# Patient Record
Sex: Male | Born: 1995 | Race: Black or African American | Hispanic: No | Marital: Married | Smoking: Never smoker
Health system: Southern US, Community
[De-identification: ages and names within clinical notes are randomized; demographics above are authoritative.]

---

## 2015-05-14 ENCOUNTER — Emergency Department (HOSPITAL_BASED_OUTPATIENT_CLINIC_OR_DEPARTMENT_OTHER): Payer: BLUE CROSS/BLUE SHIELD

## 2015-05-14 ENCOUNTER — Encounter (HOSPITAL_BASED_OUTPATIENT_CLINIC_OR_DEPARTMENT_OTHER): Payer: Self-pay | Admitting: *Deleted

## 2015-05-14 ENCOUNTER — Emergency Department (HOSPITAL_BASED_OUTPATIENT_CLINIC_OR_DEPARTMENT_OTHER)
Admission: EM | Admit: 2015-05-14 | Discharge: 2015-05-14 | Disposition: A | Discharge status: Home / Self Care | Payer: BLUE CROSS/BLUE SHIELD | Attending: Emergency Medicine | Admitting: Emergency Medicine

## 2015-05-14 DIAGNOSIS — Y929 Unspecified place or not applicable: Secondary | ICD-10-CM | POA: Diagnosis not present

## 2015-05-14 DIAGNOSIS — Y999 Unspecified external cause status: Secondary | ICD-10-CM | POA: Diagnosis not present

## 2015-05-14 DIAGNOSIS — S93401A Sprain of unspecified ligament of right ankle, initial encounter: Secondary | ICD-10-CM | POA: Insufficient documentation

## 2015-05-14 DIAGNOSIS — Y9367 Activity, basketball: Secondary | ICD-10-CM | POA: Insufficient documentation

## 2015-05-14 DIAGNOSIS — S99911A Unspecified injury of right ankle, initial encounter: Secondary | ICD-10-CM | POA: Diagnosis present

## 2015-05-14 DIAGNOSIS — X500XXA Overexertion from strenuous movement or load, initial encounter: Secondary | ICD-10-CM | POA: Diagnosis not present

## 2015-05-14 NOTE — ED Notes (Signed)
PA at bedside.

## 2015-05-14 NOTE — Discharge Instructions (Signed)
Rest, Ice intermittently (in the first 24-48 hours), Gentle compression with an Ace wrap, and elevate (Limb above the level of the heart) °  °Take up to 800mg of ibuprofen (that is usually 4 over the counter pills)  3 times a day for 5 days. Take with food. ° °Do not hesitate to return to the emergency room for any new, worsening or concerning symptoms. ° °Please obtain primary care using resource guide below. Let them know that you were seen in the emergency room and that they will need to obtain records for further outpatient management. ° ° ° °Ankle Sprain °An ankle sprain is an injury to the strong, fibrous tissues (ligaments) that hold the bones of your ankle joint together.  °CAUSES °An ankle sprain is usually caused by a fall or by twisting your ankle. Ankle sprains most commonly occur when you step on the outer edge of your foot, and your ankle turns inward. People who participate in sports are more prone to these types of injuries.  °SYMPTOMS  °· Pain in your ankle. The pain may be present at rest or only when you are trying to stand or walk. °· Swelling. °· Bruising. Bruising may develop immediately or within 1 to 2 days after your injury. °· Difficulty standing or walking, particularly when turning corners or changing directions. °DIAGNOSIS  °Your caregiver will ask you details about your injury and perform a physical exam of your ankle to determine if you have an ankle sprain. During the physical exam, your caregiver will press on and apply pressure to specific areas of your foot and ankle. Your caregiver will try to move your ankle in certain ways. An X-ray exam may be done to be sure a bone was not broken or a ligament did not separate from one of the bones in your ankle (avulsion fracture).  °TREATMENT  °Certain types of braces can help stabilize your ankle. Your caregiver can make a recommendation for this. Your caregiver may recommend the use of medicine for pain. If your sprain is severe, your  caregiver may refer you to a surgeon who helps to restore function to parts of your skeletal system (orthopedist) or a physical therapist. °HOME CARE INSTRUCTIONS  °· Apply ice to your injury for 1-2 days or as directed by your caregiver. Applying ice helps to reduce inflammation and pain. °¨ Put ice in a plastic bag. °¨ Place a towel between your skin and the bag. °¨ Leave the ice on for 15-20 minutes at a time, every 2 hours while you are awake. °· Only take over-the-counter or prescription medicines for pain, discomfort, or fever as directed by your caregiver. °· Elevate your injured ankle above the level of your heart as much as possible for 2-3 days. °· If your caregiver recommends crutches, use them as instructed. Gradually put weight on the affected ankle. Continue to use crutches or a cane until you can walk without feeling pain in your ankle. °· If you have a plaster splint, wear the splint as directed by your caregiver. Do not rest it on anything harder than a pillow for the first 24 hours. Do not put weight on it. Do not get it wet. You may take it off to take a shower or bath. °· You may have been given an elastic bandage to wear around your ankle to provide support. If the elastic bandage is too tight (you have numbness or tingling in your foot or your foot becomes cold and blue), adjust the   to make it comfortable.  If you have an air splint, you may blow more air into it or let air out to make it more comfortable. You may take your splint off at night and before taking a shower or bath. Wiggle your toes in the splint several times per day to decrease swelling. SEEK MEDICAL CARE IF:   You have rapidly increasing bruising or swelling.  Your toes feel extremely cold or you lose feeling in your foot.  Your pain is not relieved with medicine. SEEK IMMEDIATE MEDICAL CARE IF:  Your toes are numb or blue.  You have severe pain that is increasing. MAKE SURE YOU:   Understand these  instructions.  Will watch your condition.  Will get help right away if you are not doing well or get worse.   This information is not intended to replace advice given to you by your health care provider. Make sure you discuss any questions you have with your health care provider.   Document Released: 01/18/2005 Document Revised: 02/08/2014 Document Reviewed: 01/30/2011 Elsevier Interactive Patient Education 2016 [employer].  [employer] Assistance [employer] 211 is a great source of information about community services available.  Access by dialing 2-1-1 from anywhere in [place], or by website -  PooledIncome.pl.   Other Local Resources (Updated 02/2015)  Financial Assistance   Services    Phone Number and Address  [facility]  Low-cost medical care - 1st and 3rd Saturday of every month  Must not qualify for public or private insurance and must have limited income [phone] [address] [place], [place]    [place] [employer] of Social Services  Child care  Emergency assistance for housing and [employer]  Medicaid [phone] [address] [place], [place] [address]   [facility] Department  Low-cost medical care for children, communicable diseases, sexually-transmitted diseases, immunizations, maternity care, womens health and family planning [phone] [address] [place], [place] [address]  [facility]   Medication assistance for [facility] residents  Must meet income requirements [phone] [address] [place], [place].    [facility] Social Services  Child care  Emergency assistance for housing and [employer]  Medicaid [id number] [address] [place], [place] [address]  [facility]   Low-cost medical care,   Monday through Friday, 9 am  to 6 pm.   Accepts Medicare/Medicaid, and self-pay [phone] [address] [place], [place] [address]  [facility]  Low-cost medical care - Monday through Friday, 8:30 am - 5:30 pm  Accepts Medicaid and self-pay [phone] [address] [place], [place] [address]   [facility]  Primary medical care, including for those with sickle cell disease  Accepts Medicare, Medicaid, insurance and self-pay [phone] [address] [place], [place]  [facility]   Primary medical care  Accepts Medicare, [place], insurance and self-pay [phone] [address] [place], [place] [address]   [facility] Department of Social Services  Child care  Emergency assistance for housing and [employer]  Medicaid [phone] [address] [place], [place] [address]  [facility] Department of Health and [employer]  Child care  Emergency assistance for housing and [employer]  Medicaid [id number] [address] [place], [place] [address]   [facility] Medication Assistance Program  Medication assistance for [facility] residents with no insurance only  Must have a primary care doctor [phone] [address] [place], [place]  [facility]   Primary medical care  [place], [place], insurance  [phone] [address] [place], [place]  [facility]   Medication assistance [phone]  [facility]   Primary medical care  Accepts Medicare, [place], insurance and self-pay [phone] [address] [place], [place] [address]  [facility]   Primary medical care  Accepts Medicare, [place], insurance and self-pay [phone] [address] [place], [place] [address]  [facility]  For [place] residents between the ages of 62 and 30 who  do not have any form of health insurance, Medicare, [place], or [place] benefits.  Services are provided free of charge to uninsured patients who fall within federal poverty guidelines.    Hours: Tuesdays and Thursdays, 4:15 - 8 pm [phone] [address] [place], [place] [address]  [facility]     Primary medical care  Dental care  Nutritional counseling  Pharmacy  Accepts Medicaid, Medicare, most insurance.  Fees are adjusted based on ability to pay.   [phone] [facility] [address] [place], [place]  [phone] [facility] [address] [place], [place]  [phone] [facility] [place], [place]  [phone] [facility], [address] [place], [place]  [phone] [facility] [address] [place], [place]  [facility]  Womens health and family planning [phone] [address] [place], [place]  [facility] Department of Social Services  Child care  Emergency assistance for housing and [employer]  Medicaid [phone] [address], [place], [place] [address]   [facility]    Ages 20 and older  Hours: Mondays and Thursdays, 7:00 am - 9:00 am Patients are seen on a first come, first served basis. [phone], ext. [phone] [place], [place]  [facility] Division of Social Services  Child care  Emergency assistance for housing and [employer]  Medicaid [phone] [address] [place], [place] [address]  [facility]  Medication assistance  Rental assistance  Food pantry  Medication assistance  Housing assistance  Emergency food distribution  Utility assistance [phone] [address] [place], [place]  [phone]  [address] [place], [place] [address] Hours: Tuesdays and Thursdays  from 9am - 12 noon by appointment only  [phone] [address] [place], [place] [address]  [facility] Lanae Boast   Accepts private insurance, [place], and [place].  Payment is based on a sliding scale for those without insurance.  Hours: Mondays, Tuesdays and Thursdays, 8:30 am - 5:30 pm.   [phone] [address] [place], [place]  [facility] - Family Medicine at [facility], [place], and [place].  Payment is based on a sliding scale for those without insurance. [phone] [address] [place], [place]  [facility] - Pediatrics at [address] Scientist, research (physical sciences), [employer], and [place].  Payment is based on a sliding scale for those without insurance [phone] [address], [employer], [place]  [facility] - Pediatrics at [employer], [place], and [place].  Payment is based on a sliding scale for those without  insurance. [phone] [address] [place], [place]  [facility] - Pediatrics at [facility]  Accepts private insurance, [place], and [place].  Payment is based on a sliding scale for those without insurance. [phone], [phone] [place], [place].    [facility]  Maternity care.  Accepts Medicaid and self-pay. [phone] [address] [place], [place]

## 2015-05-14 NOTE — ED Provider Notes (Signed)
CSN: [id number]     Arrival date & time 05/14/15  1902 History   First MD Initiated Contact with Patient 05/14/15 1920     Chief Complaint  Patient presents with  . Ankle Pain     (Consider location/radiation/quality/duration/timing/severity/associated sxs/prior Treatment) HPI   Blood pressure 136/70, pulse 65, temperature 98.1 F (36.7 C), temperature source Oral, resp. rate 16, height 5\' 10"  (1.778 m), weight 90.719 kg, SpO2 99 %.  Larry Summers is a 20 y.o. male complaining of right ankle pain and swelling. Patient rolled ankle on Sunday while playing basketball. States that the pain and bruising started the next day after he walked on all day at work. Patient is ambulatory but with pain, no pain medication taken prior to arrival. No previous trauma or surgery to the affected joints. Patient rates his pain at 6 out of 10, exacerbated by weightbearing.  History reviewed. No pertinent past medical history. History reviewed. No pertinent past surgical history. History reviewed. No pertinent family history. Social History  Substance Use Topics  . Smoking status: Never Smoker   . Smokeless tobacco: None  . Alcohol Use: No    Review of Systems  10 systems reviewed and found to be negative, except as noted in the HPI.   Allergies  Review of patient's allergies indicates no known allergies.  Home Medications   Prior to Admission medications   Not on File   BP 141/70 mmHg  Pulse 78  Temp(Src) 98.1 F (36.7 C) (Oral)  Resp 18  Ht 5\' 10"  (1.778 m)  Wt 90.719 kg  BMI 28.70 kg/m2  SpO2 96% Physical Exam  Constitutional: He is oriented to person, place, and time. He appears well-developed and well-nourished. No distress.  HENT:  Head: Normocephalic.  Eyes: Conjunctivae and EOM are normal.  Cardiovascular: Normal rate.   Pulmonary/Chest: Effort normal. No stridor.  Musculoskeletal: Normal range of motion. He exhibits edema and tenderness.  Left ankle: No deformity, no  overlying skin changes, mild swelling, ecchymosis and tenderness to palpation along the inferior, lateral malleolus. No bony tenderness palpation, distally neurovascularly intact.   Neurological: He is alert and oriented to person, place, and time.  Psychiatric: He has a normal mood and affect.  Nursing note and vitals reviewed.   ED Course  Procedures (including critical care time) Labs Review Labs Reviewed - No data to display  Imaging Review No results found. I have personally reviewed and evaluated these images and lab results as part of my medical decision-making.   EKG Interpretation None      MDM   Final diagnoses:  Right ankle sprain, initial encounter    Filed Vitals:   05/14/15 1910 05/14/15 2016  BP: 141/70 136/70  Pulse: 78 65  Temp: 98.1 F (36.7 C)   TempSrc: Oral   Resp: 18 16  Height: 5\' 10"  (1.778 m)   Weight: 90.719 kg   SpO2: 96% 99%    Larry Summers is 20 y.o. male presenting with Left ankle pain and swelling after rolling it while playing basketball several days ago. Neurovascularly intact with mild tenderness to palpation. X-rays negative. Will treat with rest, ice, compression elevation, NSAIDS. Patient given crutches and an orthopedic follow-up.    Evaluation does not show pathology that would require ongoing emergent intervention or inpatient treatment. Pt is hemodynamically stable and mentating appropriately. Discussed findings and plan with patient/guardian, who agrees with care plan. All questions answered. Return precautions discussed and outpatient follow up given.  Wynetta Emery, PA-C 05/14/15 2128  Melene Plan, DO 05/14/15 2155

## 2015-05-14 NOTE — ED Notes (Signed)
ambulatory without limp to triage room.  Reports right ankle injury with bruising on Sunday.

## 2015-05-20 ENCOUNTER — Ambulatory Visit (INDEPENDENT_AMBULATORY_CARE_PROVIDER_SITE_OTHER): Payer: BLUE CROSS/BLUE SHIELD | Admitting: Family Medicine

## 2015-05-20 ENCOUNTER — Encounter: Payer: Self-pay | Admitting: Family Medicine

## 2015-05-20 VITALS — BP 113/72 | HR 60 | Ht 70.0 in | Wt 200.0 lb

## 2015-05-20 DIAGNOSIS — S99911A Unspecified injury of right ankle, initial encounter: Secondary | ICD-10-CM | POA: Diagnosis not present

## 2015-05-20 NOTE — Assessment & Plan Note (Signed)
independently reviewed radiographs and no evidence fracture.  MSK u/s reassuring he does not have peroneal tendon tear or occult fracture.  Reassured - continue with ankle brace.  Icing, elevation, nsaids.  Shown home exercises to do daily.  F/u in 2 weeks.  Wrote note to excuse him from PT test until at least follow-up with us.

## 2015-05-20 NOTE — Patient Instructions (Signed)
You have an ankle sprain. Ice the area for 15 minutes at a time, 3-4 times a day Aleve 2 tabs twice a day with food OR ibuprofen 3 tabs three times a day with food for pain and inflammation. Elevate above the level of your heart when possible Use your ankle brace to help with stability while you recover from this injury (wear when up and walking around). Come out of the brace twice a day to do Up/down and alphabet exercises 2-3 sets of each. Start theraband strengthening exercises when directed (1 week from now) - once a day 3 sets of 10. Consider physical therapy for strengthening and balance exercises in the future. Follow up with me in 2 weeks.

## 2015-05-20 NOTE — Progress Notes (Signed)
PCP: No primary care provider on file.  Subjective:   HPI: Patient is a 20 y.o. male here for right ankle injury.  Patient reports on 4/9 he was playing basketball. He came down and accidentally inverted his right ankle on someone else's foot Able to bear weight after this. Pain and swelling worsened after that though. Pain level now down to 4/10 and lateral. History of sprain here when in high school. Using an ankle brace. No skin changes, numbness.  No past medical history on file.  No current outpatient prescriptions on file prior to visit.   No current facility-administered medications on file prior to visit.    No past surgical history on file.  No Known Allergies  Social History   Social History  . Marital Status: Single    Spouse Name: N/A  . Number of Children: N/A  . Years of Education: N/A   Occupational History  . Not on file.   Social History Main Topics  . Smoking status: Never Smoker   . Smokeless tobacco: Not on file  . Alcohol Use: No  . Drug Use: No  . Sexual Activity: Not on file   Other Topics Concern  . Not on file   Social History Narrative    No family history on file.  BP 113/72 mmHg  Pulse 60  Ht 5\' 10"  (1.778 m)  Wt 200 lb (90.719 kg)  BMI 28.70 kg/m2  Review of Systems: See HPI above.    Objective:  Physical Exam:  Gen: NAD, comfortable in exam room  Right ankle: Mild swelling over ATFL, lateral malleolus.  No other deformity.  No ecchymoses. Mild limitation all directions. TTP greatest over ATFL, less lateral malleolus. Trace ant drawer and negative talar tilt.   Negative syndesmotic compression. Thompsons test negative. NV intact distally.  Left ankle: FROM without pain.  MSK u/s right ankle: no cortical irregularity or edema overlying cortex of fibula.  Peroneal tendons intact.    Assessment & Plan:  1. Right ankle injury - independently reviewed radiographs and no evidence fracture.  MSK u/s reassuring he  does not have peroneal tendon tear or occult fracture.  Reassured - continue with ankle brace.  Icing, elevation, nsaids.  Shown home exercises to do daily.  F/u in 2 weeks.  Wrote note to excuse him from PT test until at least follow-up with us.

## 2015-06-03 ENCOUNTER — Ambulatory Visit: Payer: BLUE CROSS/BLUE SHIELD | Admitting: Family Medicine

## 2016-12-03 IMAGING — CR DG ANKLE COMPLETE 3+V*R*
3 series · 3 of 3 positions shown · non-contrast
Comparison: None

CLINICAL DATA: Twisted RIGHT ankle playing basketball today, pain
and bruising at lateral malleolus radiating to foot

EXAM:
RIGHT ANKLE - COMPLETE 3+ VIEW

[t ankle joint ap right]
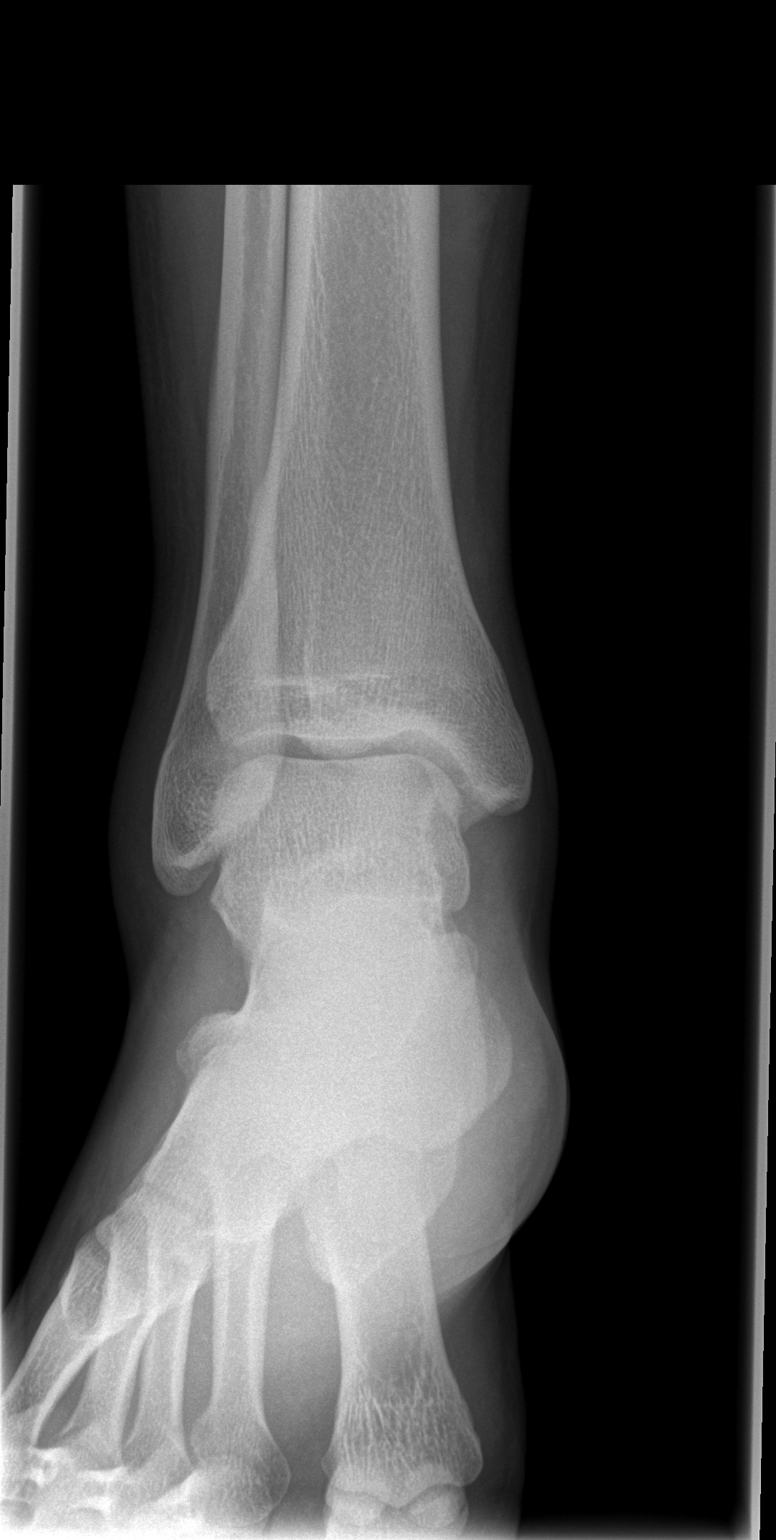

[t ankle joint oblique right]
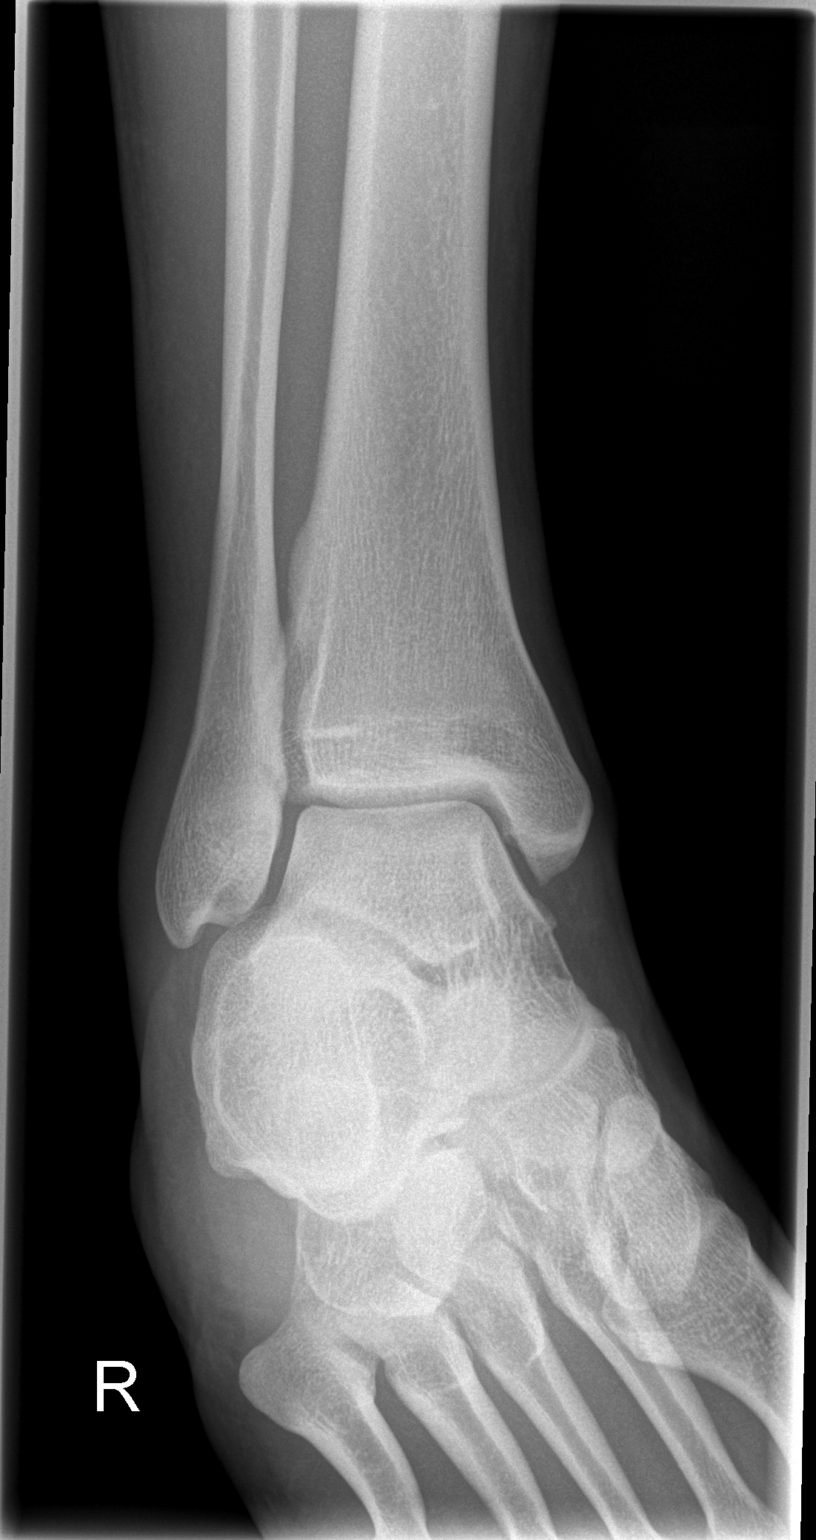

[t ankle joint lat right]
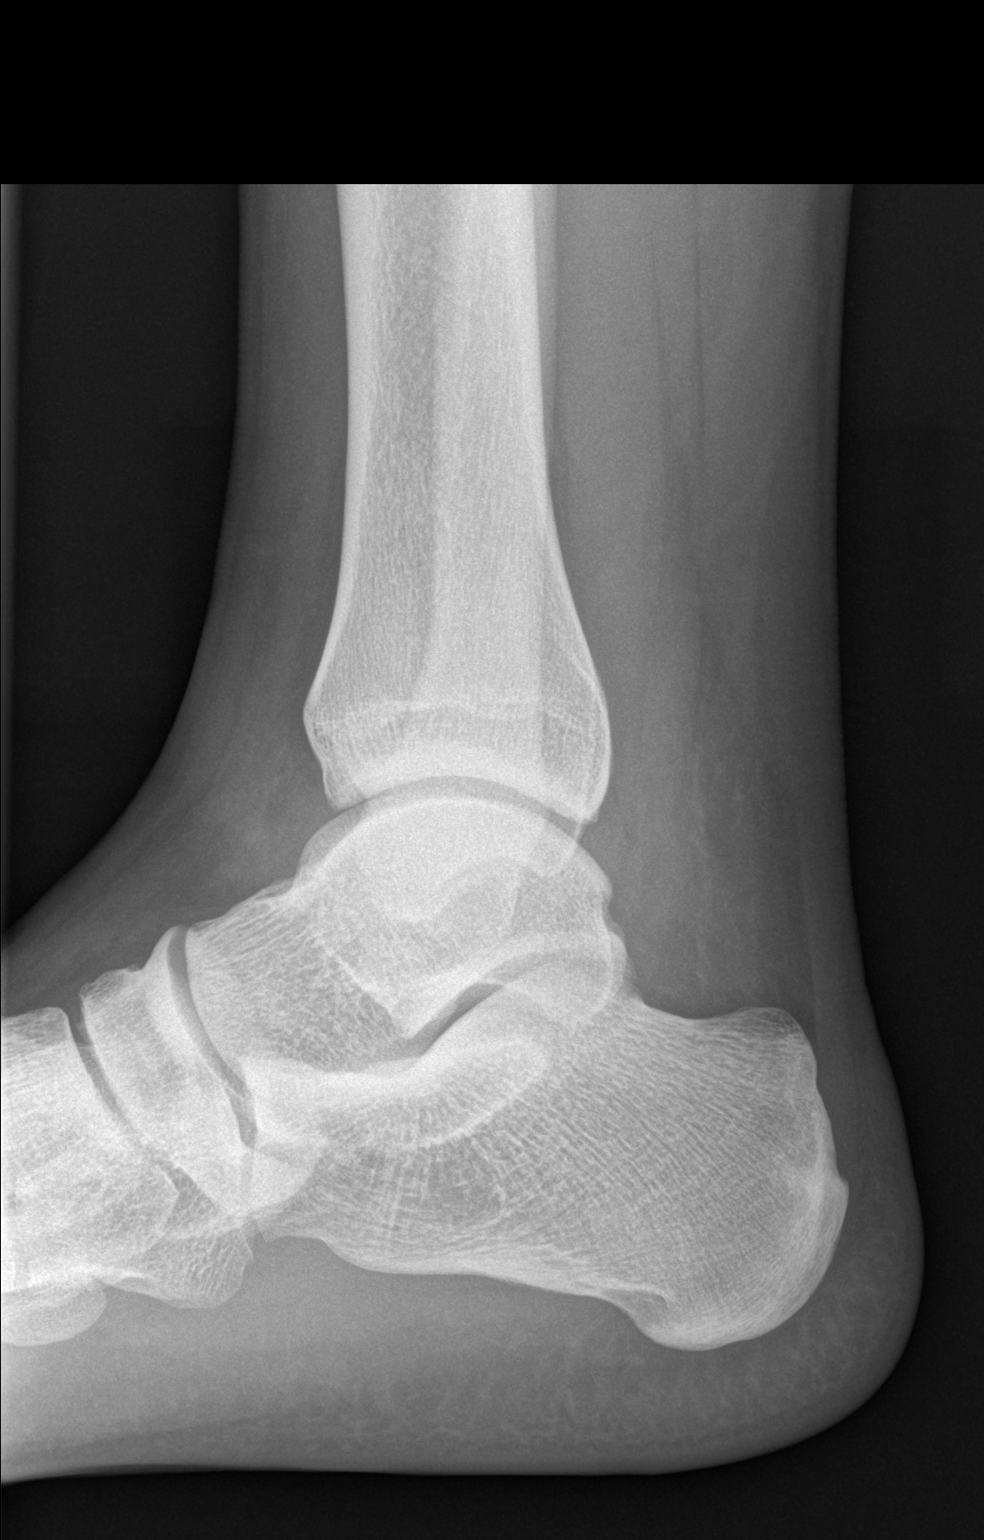

[3 of 3 positions shown; findings below may reference images not displayed]

FINDINGS: Soft tissue swelling greatest laterally.

Osseous mineralization normal.

No acute fracture, dislocation, or bone destruction.
IMPRESSION: No acute osseous abnormalities.

## 2019-03-05 ENCOUNTER — Emergency Department (HOSPITAL_BASED_OUTPATIENT_CLINIC_OR_DEPARTMENT_OTHER)

## 2019-03-05 ENCOUNTER — Other Ambulatory Visit: Payer: Self-pay

## 2019-03-05 ENCOUNTER — Encounter (HOSPITAL_BASED_OUTPATIENT_CLINIC_OR_DEPARTMENT_OTHER): Payer: Self-pay | Admitting: *Deleted

## 2019-03-05 ENCOUNTER — Emergency Department (HOSPITAL_BASED_OUTPATIENT_CLINIC_OR_DEPARTMENT_OTHER)
Admission: EM | Admit: 2019-03-05 | Discharge: 2019-03-05 | Disposition: A | Discharge status: Home / Self Care | Attending: Emergency Medicine | Admitting: Emergency Medicine

## 2019-03-05 DIAGNOSIS — R079 Chest pain, unspecified: Secondary | ICD-10-CM | POA: Diagnosis present

## 2019-03-05 DIAGNOSIS — R0789 Other chest pain: Secondary | ICD-10-CM

## 2019-03-05 DIAGNOSIS — M546 Pain in thoracic spine: Secondary | ICD-10-CM | POA: Diagnosis not present

## 2019-03-05 NOTE — ED Provider Notes (Signed)
[facility] DEPARTMENT Provider Note   CSN: [id number] Arrival date & time: 03/05/19  2951     History Chief Complaint  Patient presents with  . Chest Pain    Larry Summers is a 24 y.o. male has significant past medical history, presenting to the emergency department with complaint of constant, waxing and waning left-sided chest and back pain that began yesterday.  Patient states he feels a tightness sensation.  It has been constant though intermittently worse and better.  He states for work he works at a [employer] center and does frequent heavy lifting.  He cannot recall any injuries.  No particular aggravating or alleviating factors.  He has not treated his symptoms.  He denies fevers, cough, congestion.  No known Covid contacts.  No nausea, vomiting, diaphoresis, shortness of breath.  No cardiac history or history of DVT/PE.  No significant cardiac history in first-degree relatives before the age of 26.  Denies personal history of hypertension, hyperlipidemia, diabetes, tobacco use.  No recent surgeries, prolonged immobilization, calf pain or swelling.  The history is provided by the patient.       History reviewed. No pertinent past medical history.  Patient Active Problem List   Diagnosis Date Noted  . Right ankle injury 05/20/2015    History reviewed. No pertinent surgical history.     No family history on file.  Social History   Tobacco Use  . Smoking status: Never Smoker  Substance Use Topics  . Alcohol use: No    Alcohol/week: 0.0 standard drinks  . Drug use: No    Home Medications Prior to Admission medications   Not on File    Allergies    Patient has no known allergies.  Review of Systems   Review of Systems  All other systems reviewed and are negative.   Physical Exam Updated Vital Signs BP 129/83 (BP Location: Right Arm)   Pulse 63   Temp 98.2 F (36.8 C) (Oral)   Resp 18   Ht 5\' 10"  (1.778 m)   Wt  88.5 kg   SpO2 100%   BMI 27.98 kg/m   Physical Exam Vitals and nursing note reviewed.  Constitutional:      General: He is not in acute distress.    Appearance: He is well-developed. He is not ill-appearing.  HENT:     Head: Normocephalic and atraumatic.  Eyes:     Conjunctiva/sclera: Conjunctivae normal.  Cardiovascular:     Rate and Rhythm: Normal rate and regular rhythm.     Heart sounds: Normal heart sounds.     Comments: Normal and equal radial pulses Pulmonary:     Effort: Pulmonary effort is normal. No respiratory distress.     Breath sounds: Normal breath sounds.  Chest:     Chest wall: No tenderness.  Abdominal:     General: Bowel sounds are normal.     Palpations: Abdomen is soft.     Tenderness: There is no abdominal tenderness.  Musculoskeletal:     Comments: Nl ROM of BUE  Skin:    General: Skin is warm.     Findings: No rash.  Neurological:     Mental Status: He is alert.     Comments: Strong grip strength BUE.   Psychiatric:        Behavior: Behavior normal.     ED Results / Procedures / Treatments   Labs (all labs ordered are listed, but only abnormal results are displayed) Labs Reviewed -  No data to display  EKG EKG Interpretation  Date/Time:  Monday March 05 2019 09:27:45 EST Ventricular Rate:  74 PR Interval:    QRS Duration: 85 QT Interval:  398 QTC Calculation: 442 R Axis:   80 Text Interpretation: Sinus rhythm ST elev, probable normal early repol pattern No old tracing to compare Confirmed by Pricilla Loveless [phone]) on 03/05/2019 9:31:49 AM   Radiology DG Chest 2 View  Result Date: 03/05/2019 CLINICAL DATA:  24 year old male with a history of chest pain and back pain EXAM: CHEST - 2 VIEW COMPARISON:  None. FINDINGS: The heart size and mediastinal contours are within normal limits. Both lungs are clear. The visualized skeletal structures are unremarkable. IMPRESSION: No active cardiopulmonary disease. Electronically Signed   By: Gilmer Mor D.O.   On: 03/05/2019 09:58    Procedures Procedures (including critical care time)  Medications Ordered in ED Medications - No data to display  ED Course  I have reviewed the triage vital signs and the nursing notes.  Pertinent labs & imaging results that were available during my care of the patient were reviewed by me and considered in my medical decision making (see chart for details).    MDM Rules/Calculators/A&P                      Patient is a 24 year old healthy male, presenting with left chest and back pain that began yesterday, constant in nature, waxing and waning.  He does do heavy lifting for work though does not recall a particular injury.  No respiratory symptoms.  No risk factors for cardiac disease or PE.  PERC negative.  Vital signs are normal.  Heart and lung exam is normal.  Equal pulses.  EKG is nonischemic.  Chest x-ray is negative.  Pt did report pain worsening when he raised his arms completely overhead during xray. Suspect musculoskeletal in nature. Low suspicion for ACS or life-threatening pulmonary etiology of symptoms.  Will recommend symptomatic management and strict return precautions at this time.  Patient is agreeable to plan and safe for discharge.  Discussed results, findings, treatment and follow up. Patient advised of return precautions. Patient verbalized understanding and agreed with plan.  Final Clinical Impression(s) / ED Diagnoses Final diagnoses:  Other chest pain  Acute left-sided thoracic back pain    Rx / DC Orders ED Discharge Orders    None       Jaquay Morneault, [place] N, PA-C 03/05/19 1038    Pricilla Loveless, MD 03/05/19 1209

## 2019-03-05 NOTE — ED Triage Notes (Signed)
Chest and back pain onset yesterday am denies inj, no change w movement,  Denies n/v  No shortness of breath

## 2019-03-05 NOTE — Discharge Instructions (Addendum)
Please read instructions below. Treat your symptoms with over-the-counter medications such as tylenol or advil. Follow up with your primary care provider if your symptoms persist.  Return to the ER for new or worsening symptoms; including worsening chest pain, shortness of breath, pain that radiates to the arm or neck, pain or shortness of breath worsened with exertion.

## 2020-09-24 IMAGING — CR DG CHEST 2V
2 series · 2 of 2 positions shown · non-contrast
Comparison: None.

CLINICAL DATA: 23-year-old male with a history of chest pain and
back pain

EXAM:
CHEST - 2 VIEW

[w chest pa]
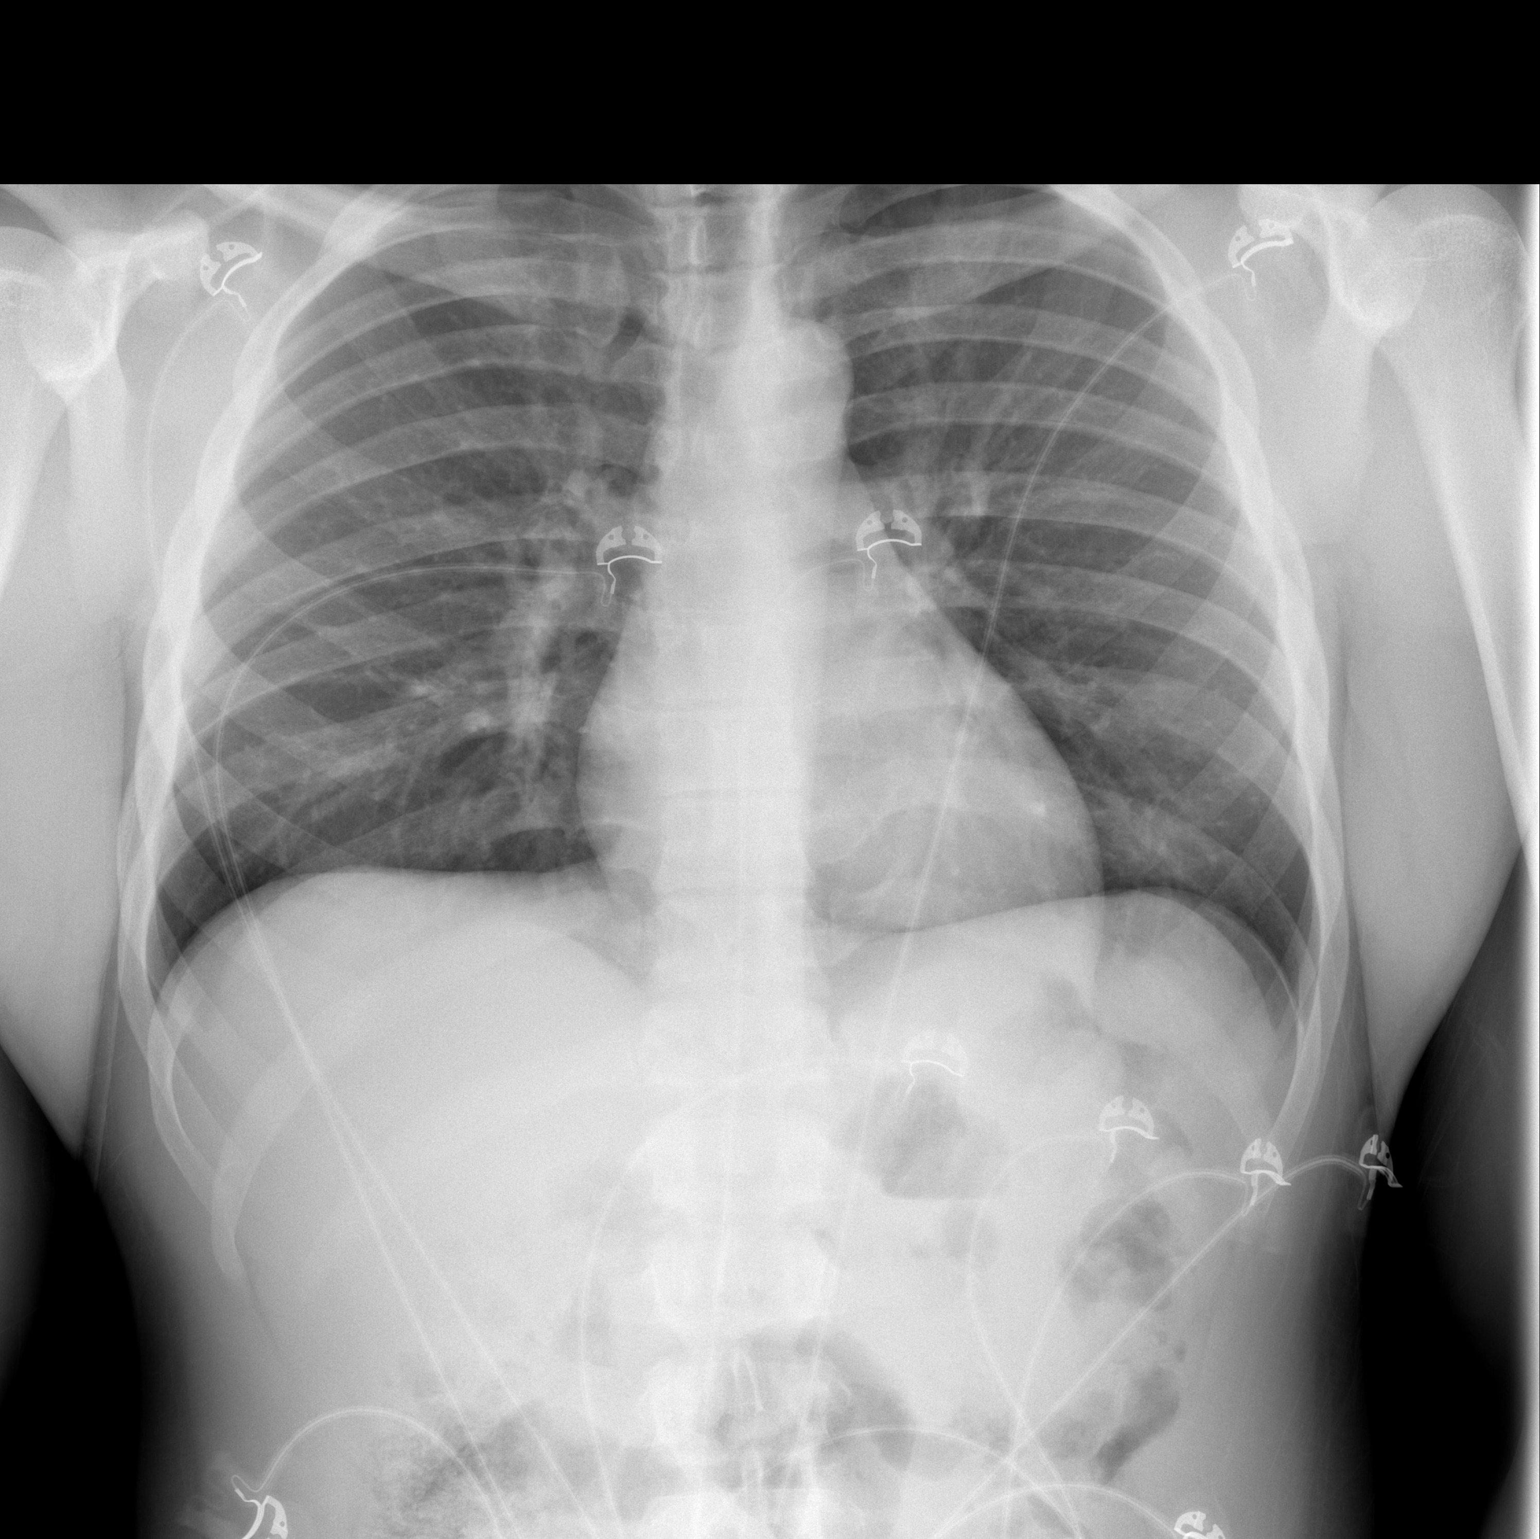

[w chest lat]
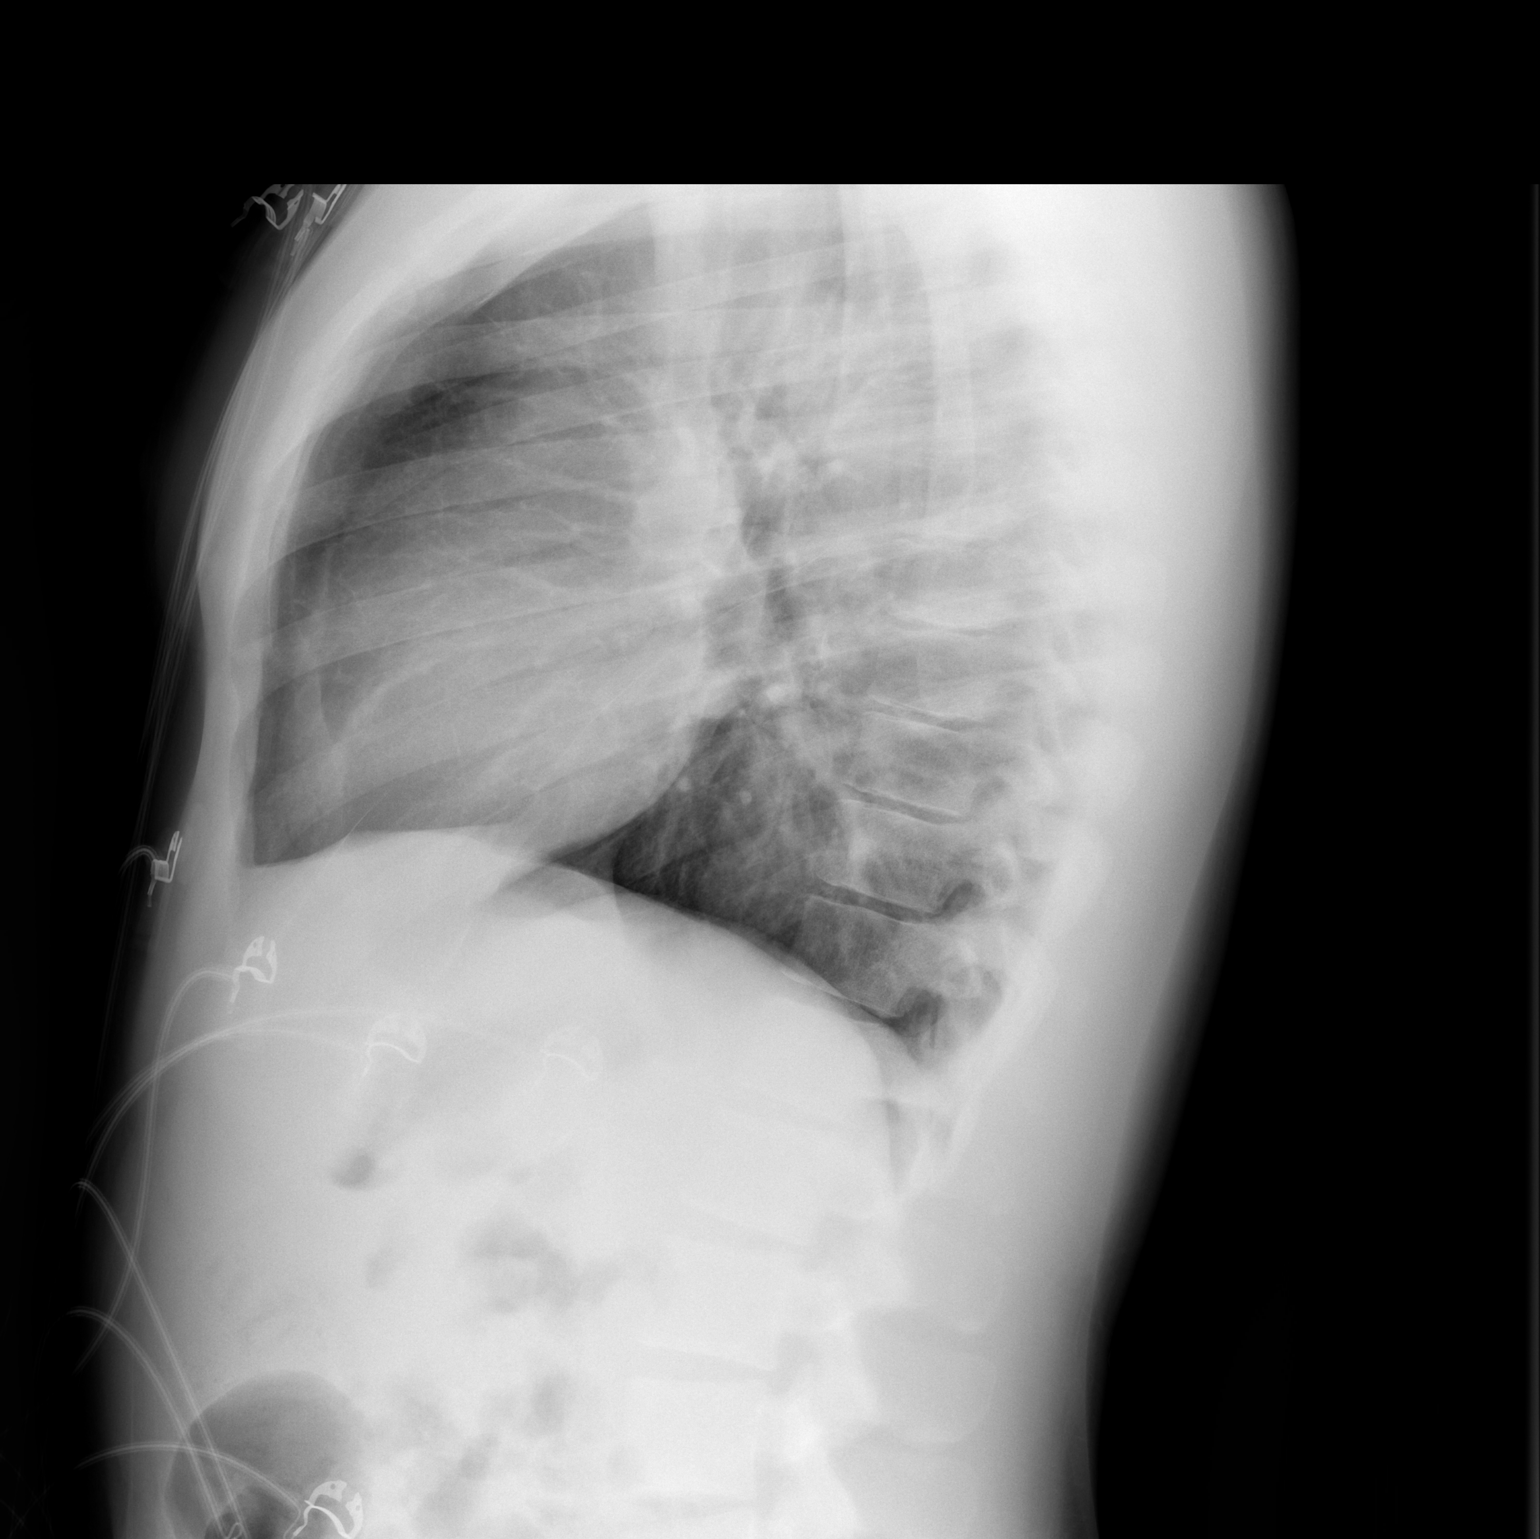

[2 of 2 positions shown; findings below may reference images not displayed]

FINDINGS: The heart size and mediastinal contours are within normal limits.
Both lungs are clear. The visualized skeletal structures are
unremarkable.
IMPRESSION: No active cardiopulmonary disease.
